# Patient Record
Sex: Male | Born: 1960 | Race: Black or African American | Hispanic: No | Marital: Single | Smoking: Never smoker
Health system: Southern US, Community
[De-identification: ages and names within clinical notes are randomized; demographics above are authoritative.]

---

## 2004-05-18 ENCOUNTER — Emergency Department (HOSPITAL_COMMUNITY): Admission: EM | Admit: 2004-05-18 | Discharge: 2004-05-19 | Payer: Self-pay | Admitting: Emergency Medicine

## 2017-04-18 ENCOUNTER — Emergency Department (HOSPITAL_COMMUNITY)
Admission: EM | Admit: 2017-04-18 | Discharge: 2017-04-18 | Disposition: A | Discharge status: Home / Self Care | Payer: Self-pay | Attending: Emergency Medicine | Admitting: Emergency Medicine

## 2017-04-18 ENCOUNTER — Encounter (HOSPITAL_COMMUNITY): Payer: Self-pay | Admitting: *Deleted

## 2017-04-18 ENCOUNTER — Emergency Department (HOSPITAL_COMMUNITY): Payer: Self-pay

## 2017-04-18 DIAGNOSIS — Z23 Encounter for immunization: Secondary | ICD-10-CM | POA: Insufficient documentation

## 2017-04-18 DIAGNOSIS — Y998 Other external cause status: Secondary | ICD-10-CM | POA: Insufficient documentation

## 2017-04-18 DIAGNOSIS — W25XXXA Contact with sharp glass, initial encounter: Secondary | ICD-10-CM | POA: Insufficient documentation

## 2017-04-18 DIAGNOSIS — S91311A Laceration without foreign body, right foot, initial encounter: Secondary | ICD-10-CM | POA: Insufficient documentation

## 2017-04-18 DIAGNOSIS — Y939 Activity, unspecified: Secondary | ICD-10-CM | POA: Insufficient documentation

## 2017-04-18 DIAGNOSIS — Y929 Unspecified place or not applicable: Secondary | ICD-10-CM | POA: Insufficient documentation

## 2017-04-18 MED ORDER — TETANUS-DIPHTH-ACELL PERTUSSIS 5-2.5-18.5 LF-MCG/0.5 IM SUSP
0.5000 mL | Freq: Once | INTRAMUSCULAR | Status: AC
Start: 2017-04-18 — End: 2017-04-18
  Administered 2017-04-18: 0.5 mL via INTRAMUSCULAR
  Filled 2017-04-18: qty 0.5

## 2017-04-18 MED ORDER — CEPHALEXIN 500 MG PO CAPS: 500.0000 mg | ORAL_CAPSULE | Freq: Two times a day (BID) | ORAL | 0 refills | Status: AC

## 2017-04-18 MED ORDER — MUPIROCIN CALCIUM 2 % EX CREA
TOPICAL_CREAM | Freq: Once | CUTANEOUS | Status: AC
  Administered 2017-04-18: 14:00:00 via TOPICAL
  Filled 2017-04-18: qty 15

## 2017-04-18 NOTE — ED Triage Notes (Signed)
Pt states that he had a piece of glass go through his shoe 2 days ago. Pt states that he removed the glass however has had to work and walk on it last night.

## 2017-04-18 NOTE — Discharge Instructions (Signed)
Take antibiotics as prescribed.  Take the entire course of antibiotics, even if your symptoms improve. Keep the area clean.  Apply dressing if there is drainage or blood.  Use Tylenol or ibuprofen as needed for pain. Follow-up in the emergency department if you develop fevers, worsening pain, pus draining from the area, or any new or concerning symptoms.

## 2017-04-18 NOTE — ED Provider Notes (Signed)
[facility] EMERGENCY DEPARTMENT Provider Note   CSN: [id number] Arrival date & time: 04/18/17  1123     History   Chief Complaint Chief Complaint  Patient presents with  . Foot Pain    HPI Troy Mcfarland is a 57 y.o. male presenting for evaluation of right foot pain.  Pt states 2 days ago, he stepped on a piece of glass.  It went through his shoe and into his foot.  He was able to pull the glass out, clean the foot, and apply dressing. He reports pain only with direct palpation of the cut and when walking.  He has not been using any Tylenol or ibuprofen, feels is not necessary.  He has no other medical problems, is not immunocompromised.  No history of diabetes.  Tetanus shot was more than 10 years ago.  He denies drainage or significant bleeding from the cut.  He is not on blood thinners.   HPI  History reviewed. No pertinent past medical history.  There are no active problems to display for this patient.   History reviewed. No pertinent surgical history.     Home Medications    Prior to Admission medications   Medication Sig Start Date End Date Taking? Authorizing Provider  cephALEXin (KEFLEX) 500 MG capsule Take 1 capsule (500 mg total) by mouth 2 (two) times daily for 5 days. 04/18/17 04/23/17  Priyansh Pry, PA-C    Family History No family history on file.  Social History Social History   Tobacco Use  . Smoking status: Not on file  Substance Use Topics  . Alcohol use: Not on file  . Drug use: Not on file     Allergies   Patient has no known allergies.   Review of Systems Review of Systems  Skin: Positive for wound.  Allergic/Immunologic: Negative for immunocompromised state.  Hematological: Does not bruise/bleed easily.     Physical Exam Updated Vital Signs BP 124/76   Pulse 68   Temp 98.3 F (36.8 C) (Oral)   Resp 16   SpO2 100%   Physical Exam  Constitutional: He is oriented to person, place, and time. He  appears well-developed and well-nourished. No distress.  HENT:  Head: Normocephalic and atraumatic.  Eyes: EOM are normal.  Neck: Normal range of motion.  Pulmonary/Chest: Effort normal.  Abdominal: He exhibits no distension.  Musculoskeletal: Normal range of motion.  Neurological: He is alert and oriented to person, place, and time.  Skin: Skin is warm. No rash noted.  Small, <1 cm lac of the R plant foot. No active bleeding. Appears clean. No surrounding cellulitis. No drainage. No surrounding tenderness, TTP only directly over the cut  Psychiatric: He has a normal mood and affect.  Nursing note and vitals reviewed.    ED Treatments / Results  Labs (all labs ordered are listed, but only abnormal results are displayed) Labs Reviewed - No data to display  EKG  EKG Interpretation None       Radiology Dg Foot Complete Right  Result Date: 04/18/2017 CLINICAL DATA:  Rule out glass in foot.  Stepped on piece of glass. EXAM: RIGHT FOOT COMPLETE - 3+ VIEW COMPARISON:  None. FINDINGS: No acute bony abnormality. Specifically, no fracture, subluxation, or dislocation. No radiopaque foreign body. IMPRESSION: Negative. Electronically Signed   By: Charlett Nose M.D.   On: 04/18/2017 13:33    Procedures Procedures (including critical care time)  Medications Ordered in ED Medications  Tdap (BOOSTRIX) injection 0.5 mL (not  administered)  mupirocin cream (BACTROBAN) 2 % (not administered)     Initial Impression / Assessment and Plan / ED Course  I have reviewed the triage vital signs and the nursing notes.  Pertinent labs & imaging results that were available during my care of the patient were reviewed by me and considered in my medical decision making (see chart for details).     Patient presenting for evaluation of foot laceration from a piece of glass.  No sign of infection at this time.  No bleeding.  Will obtain x-ray to ensure there is no glass in the foot.  Tetanus updated.   Wound cleaned and dressed.  X-ray reviewed and interpreted by me.  Shows no glass in the foot.  Wound soaked in Betadine, Bactroban ointment placed, and wound was dressed.  Will place patient on Keflex, as he has an open wound, for infection prophylaxis.  Tylenol or ibuprofen for pain.  At this time, patient appears safe for discharge.  Return precautions given.  Patient states he understands and agrees to plan.  Final Clinical Impressions(s) / ED Diagnoses   Final diagnoses:  Foot laceration, right, initial encounter    ED Discharge Orders        Ordered    cephALEXin (KEFLEX) 500 MG capsule  2 times daily     04/18/17 1358       Miaisabella Bacorn, PA-C 04/18/17 1400    Benjiman CorePickering, Nathan, MD 04/18/17 1555

## 2017-04-18 NOTE — ED Notes (Signed)
Patient transported to X-ray 

## 2018-09-22 IMAGING — CR DG FOOT COMPLETE 3+V*R*
3 series · 3 of 3 positions shown · non-contrast
Comparison: None.

CLINICAL DATA: Rule out glass in foot.  Stepped on piece of glass.

EXAM:
RIGHT FOOT COMPLETE - 3+ VIEW

[foot ap]
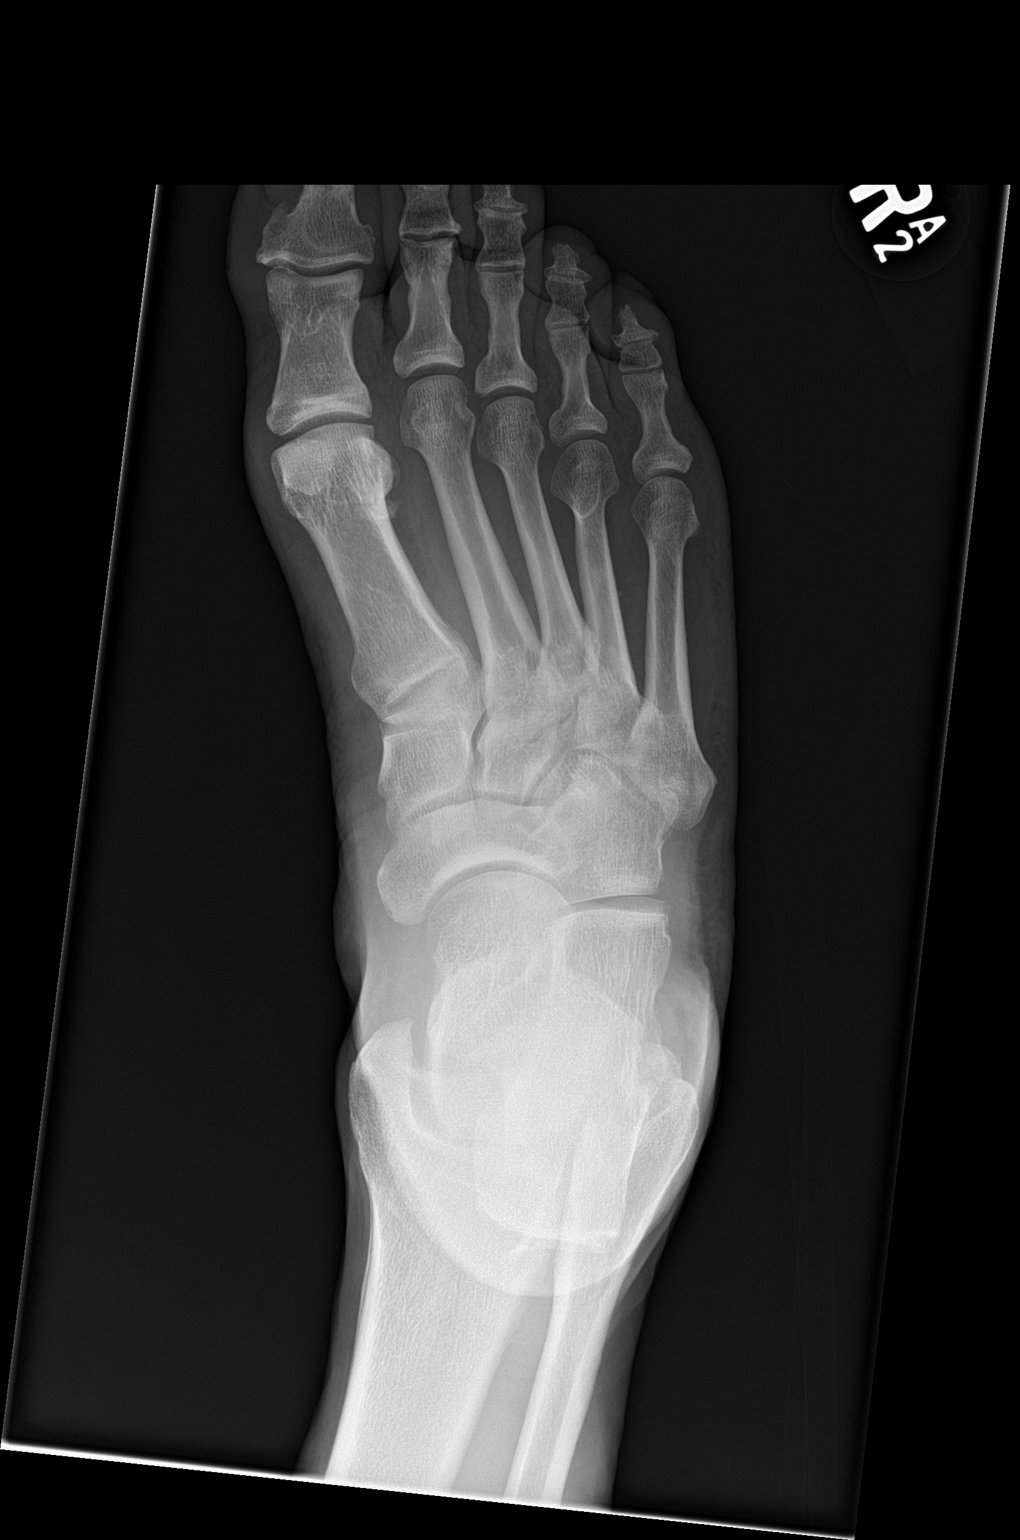

[foot obl]
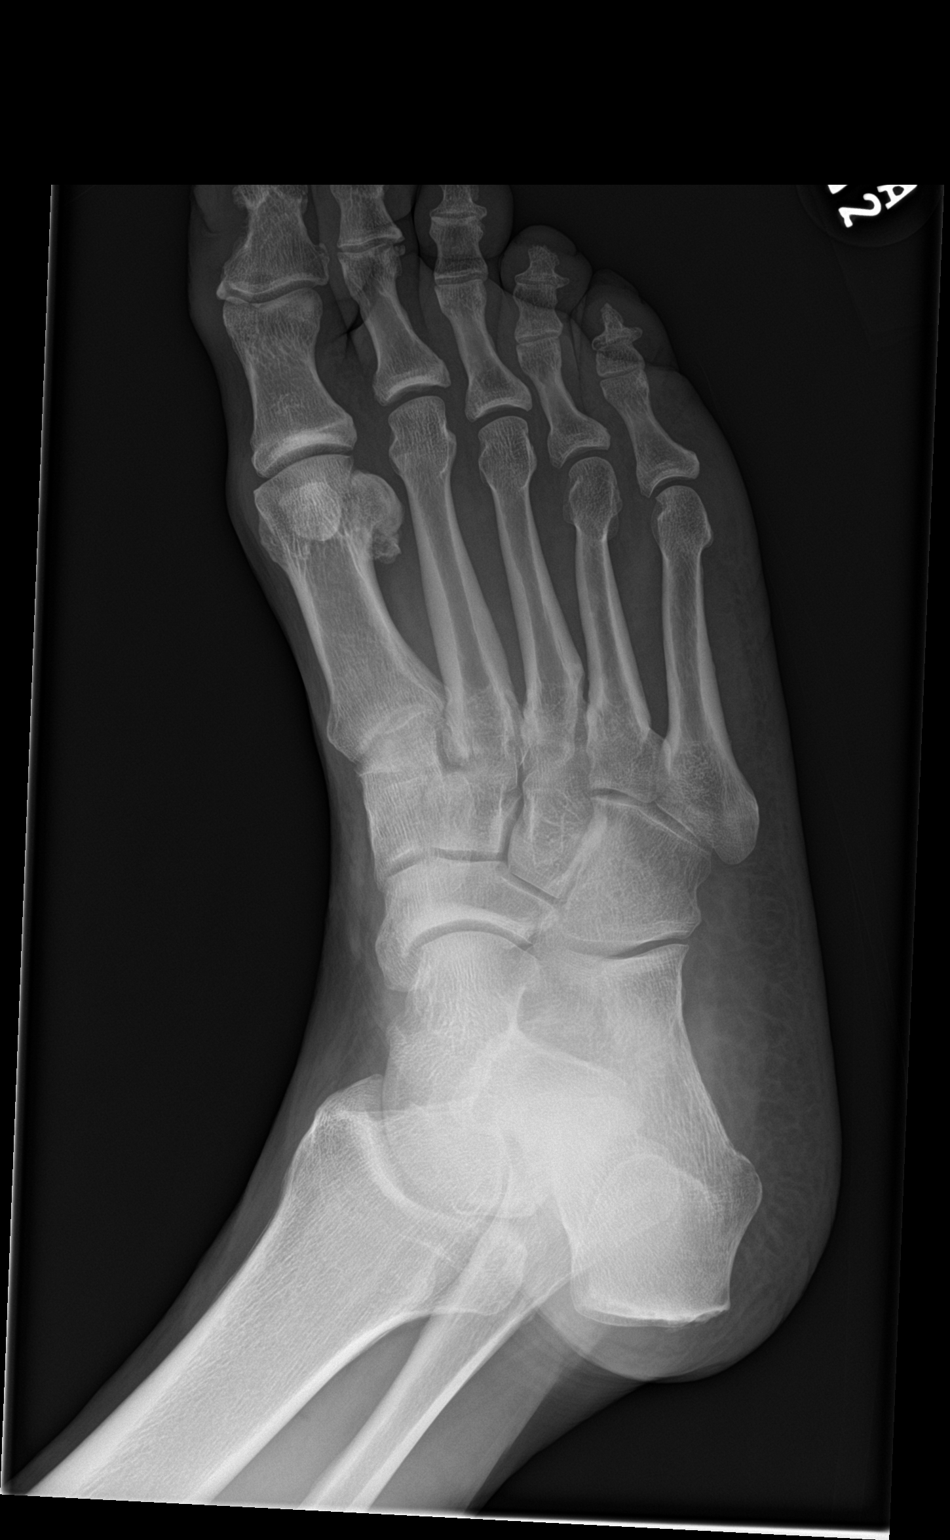

[foot lat]
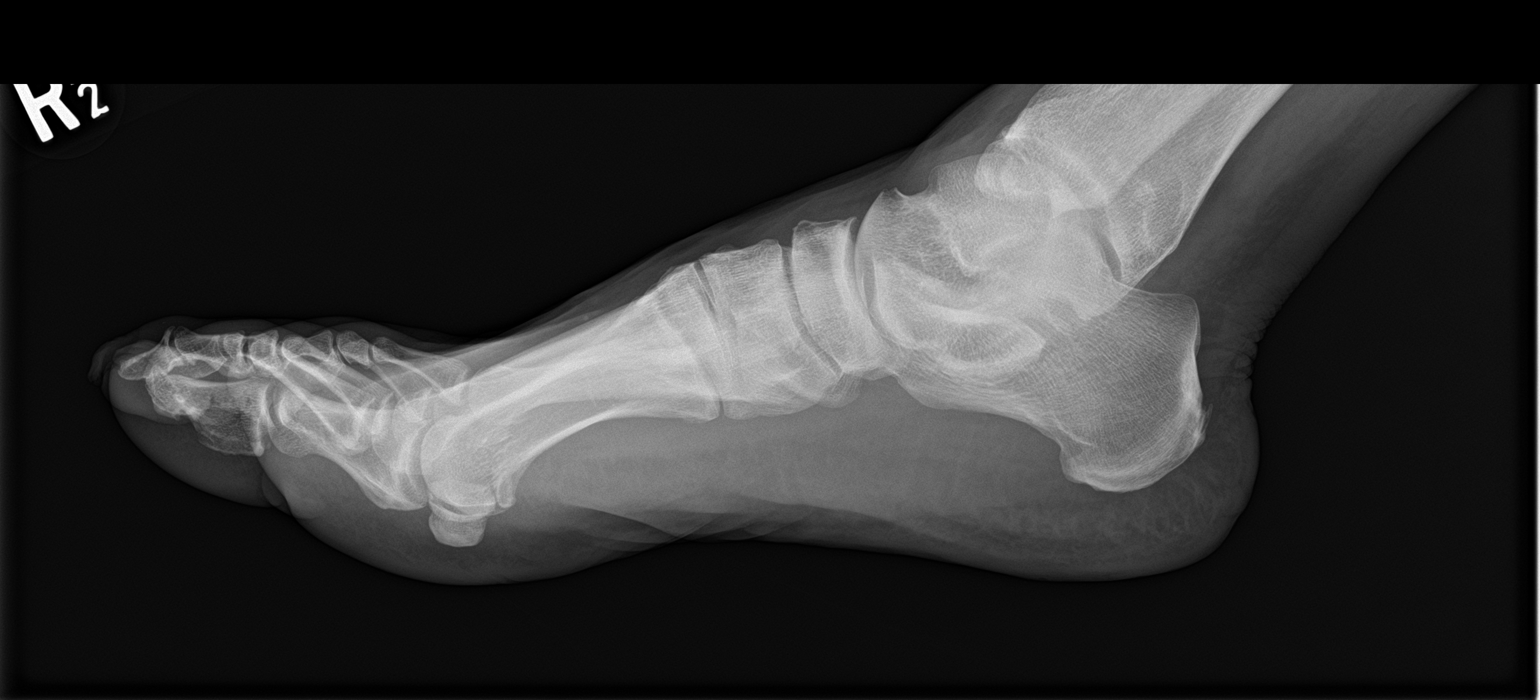

[3 of 3 positions shown; findings below may reference images not displayed]

FINDINGS: No acute bony abnormality. Specifically, no fracture, subluxation,
or dislocation. No radiopaque foreign body.
IMPRESSION: Negative.

## 2023-06-08 ENCOUNTER — Encounter (HOSPITAL_COMMUNITY): Payer: Self-pay | Admitting: Emergency Medicine

## 2023-06-08 ENCOUNTER — Ambulatory Visit (HOSPITAL_COMMUNITY): Admission: EM | Admit: 2023-06-08 | Discharge: 2023-06-08 | Disposition: A | Discharge status: Home / Self Care

## 2023-06-08 ENCOUNTER — Other Ambulatory Visit: Payer: Self-pay

## 2023-06-08 DIAGNOSIS — S40021A Contusion of right upper arm, initial encounter: Secondary | ICD-10-CM | POA: Diagnosis not present
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
## 2023-06-08 NOTE — Discharge Instructions (Signed)
 As discussed you have a contusion to your right upper arm with possible underlying hematoma.  This should heal on its own but may take time.  I recommend applying ice to help with swelling.  You can take 650 mg of Tylenol every 4-6 hours as needed for pain.  If you notice worsening swelling, pain, numbness, or weakness to your arm you can return here for reevaluation.  If your symptoms persist follow-up with your primary care provider.
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
## 2023-11-10 ENCOUNTER — Encounter: Admitting: Family

## 2023-11-10 ENCOUNTER — Telehealth: Payer: Self-pay | Admitting: General Practice

## 2023-11-10 NOTE — Progress Notes (Signed)
 Erroneous encounter-disregard

## 2023-11-10 NOTE — Telephone Encounter (Signed)
 Called pt to reschedule missed NP appt at [facility]; could not reach or leave vm
